# Patient Record
Sex: Female | Born: 1985 | Race: Black or African American | Hispanic: No | Marital: Married | Smoking: Never smoker
Health system: Southern US, Community
[De-identification: ages and names within clinical notes are randomized; demographics above are authoritative.]

## PROBLEM LIST (undated history)

## (undated) DIAGNOSIS — R200 Anesthesia of skin: Secondary | ICD-10-CM

## (undated) DIAGNOSIS — M79643 Pain in unspecified hand: Secondary | ICD-10-CM

## (undated) HISTORY — DX: Pain in unspecified hand: M79.643

## (undated) HISTORY — PX: OTHER SURGICAL HISTORY: SHX169

## (undated) HISTORY — DX: Anesthesia of skin: R20.0

---
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
## 2014-03-21 ENCOUNTER — Ambulatory Visit: Payer: Self-pay | Admitting: General Practice

## 2014-06-15 ENCOUNTER — Encounter: Payer: Self-pay | Admitting: Nurse Practitioner

## 2014-06-15 ENCOUNTER — Ambulatory Visit (INDEPENDENT_AMBULATORY_CARE_PROVIDER_SITE_OTHER): Payer: Medicaid Other | Admitting: Nurse Practitioner

## 2014-06-15 VITALS — BP 102/56 | HR 80 | Temp 98.6°F | Ht 63.0 in | Wt 150.0 lb

## 2014-06-15 DIAGNOSIS — Z7251 High risk heterosexual behavior: Secondary | ICD-10-CM

## 2014-06-15 DIAGNOSIS — B373 Candidiasis of vulva and vagina: Secondary | ICD-10-CM

## 2014-06-15 DIAGNOSIS — B3731 Acute candidiasis of vulva and vagina: Secondary | ICD-10-CM

## 2014-06-15 MED ORDER — FLUCONAZOLE 150 MG PO TABS: 150.0000 mg | ORAL_TABLET | Freq: Once | ORAL | Status: DC

## 2014-06-15 NOTE — Progress Notes (Signed)
   Subjective:    Patient ID: Michele Buckley, female    DOB: 09/20/1986, 28 y.o.   MRN: [id number]  HPI  Patient comes in stating that she had intercourse and the condom they were using was lost aand she thinks that it may be inside her.    Review of Systems  Constitutional: Negative.   HENT: Negative.   Respiratory: Negative.   Cardiovascular: Negative.   Genitourinary: Negative.   Neurological: Negative.   Psychiatric/Behavioral: Negative.   All other systems reviewed and are negative.      Objective:   Physical Exam  Constitutional: She is oriented to person, place, and time. She appears well-developed and well-nourished.  Cardiovascular: Normal rate and normal heart sounds.   Pulmonary/Chest: Effort normal and breath sounds normal.  Genitourinary: Vagina normal and uterus normal.  No foreign object found in vagina- thick white cheesy appearing discharge found  Neurological: She is alert and oriented to person, place, and time.  Skin: Skin is warm and dry.  Psychiatric: She has a normal mood and affect. Her behavior is normal. Judgment and thought content normal.    BP 102/56  Pulse 80  Temp(Src) 98.6 F (37 C) (Oral)  Ht 5\' 3"  (1.6 m)  Wt 150 lb (68.04 kg)  BMI 26.58 kg/m2  LMP 05/30/2014       Assessment & Plan:  1. High risk sexual behavior Continue to practice safe sex - STD Panel (HBSAG,HIV,RPR) - GC/Chlamydia Probe Amp  2. Vaginal candidiasis No douching - fluconazole (DIFLUCAN) 150 MG tablet; Take 1 tablet (150 mg total) by mouth once.  Dispense: 1 tablet; Refill: 0  Mary-Margaret Daphine DeutscherMartin, FNP

## 2014-06-20 ENCOUNTER — Telehealth: Payer: Self-pay | Admitting: Nurse Practitioner

## 2014-06-20 LAB — GC/CHLAMYDIA PROBE AMP
Chlamydia trachomatis, NAA: NEGATIVE
Neisseria gonorrhoeae by PCR: NEGATIVE

## 2014-06-21 ENCOUNTER — Telehealth: Payer: Self-pay | Admitting: Family Medicine

## 2014-06-21 NOTE — Telephone Encounter (Signed)
Message copied by Azalee CourseFULP, Emmaleigh Longo on Tue Jun 21, 2014 11:43 AM ------      Message from: Bennie PieriniMARTIN, MARY-MARGARET      Created: Mon Jun 20, 2014  7:42 AM       G/C negative ------

## 2014-06-21 NOTE — Telephone Encounter (Signed)
Just wait and see if symptoms return when complete period

## 2014-06-22 NOTE — Telephone Encounter (Signed)
Patient aware.

## 2014-07-01 ENCOUNTER — Telehealth: Payer: Self-pay | Admitting: Nurse Practitioner

## 2014-07-01 ENCOUNTER — Ambulatory Visit (INDEPENDENT_AMBULATORY_CARE_PROVIDER_SITE_OTHER): Payer: Medicaid Other | Admitting: Nurse Practitioner

## 2014-07-01 ENCOUNTER — Encounter: Payer: Self-pay | Admitting: Nurse Practitioner

## 2014-07-01 VITALS — BP 100/62 | HR 72 | Temp 98.0°F | Ht 63.0 in

## 2014-07-01 DIAGNOSIS — B9689 Other specified bacterial agents as the cause of diseases classified elsewhere: Secondary | ICD-10-CM

## 2014-07-01 DIAGNOSIS — A499 Bacterial infection, unspecified: Secondary | ICD-10-CM

## 2014-07-01 DIAGNOSIS — N898 Other specified noninflammatory disorders of vagina: Secondary | ICD-10-CM

## 2014-07-01 DIAGNOSIS — N760 Acute vaginitis: Secondary | ICD-10-CM

## 2014-07-01 LAB — POCT URINALYSIS DIPSTICK
BILIRUBIN UA: NEGATIVE
Glucose, UA: NEGATIVE
Ketones, UA: NEGATIVE
Leukocytes, UA: NEGATIVE
NITRITE UA: NEGATIVE
Protein, UA: NEGATIVE
RBC UA: NEGATIVE
SPEC GRAV UA: 1.01
Urobilinogen, UA: NEGATIVE
pH, UA: 7

## 2014-07-01 LAB — POCT UA - MICROSCOPIC ONLY
Bacteria, U Microscopic: NEGATIVE
Casts, Ur, LPF, POC: NEGATIVE
Crystals, Ur, HPF, POC: NEGATIVE
MUCUS UA: NEGATIVE
RBC, urine, microscopic: NEGATIVE
WBC, UR, HPF, POC: NEGATIVE
Yeast, UA: NEGATIVE

## 2014-07-01 LAB — POCT WET PREP (WET MOUNT): KOH Wet Prep POC: POSITIVE

## 2014-07-01 MED ORDER — METRONIDAZOLE 500 MG PO TABS: 500.0000 mg | ORAL_TABLET | Freq: Two times a day (BID) | ORAL | Status: DC

## 2014-07-01 NOTE — Patient Instructions (Signed)
Bacterial Vaginosis Bacterial vaginosis is a vaginal infection that occurs when the normal balance of bacteria in the vagina is disrupted. It results from an overgrowth of certain bacteria. This is the most common vaginal infection in women of childbearing age. Treatment is important to prevent complications, especially in pregnant women, as it can cause a premature delivery. CAUSES  Bacterial vaginosis is caused by an increase in harmful bacteria that are normally present in smaller amounts in the vagina. Several different kinds of bacteria can cause bacterial vaginosis. However, the reason that the condition develops is not fully understood. RISK FACTORS Certain activities or behaviors can put you at an increased risk of developing bacterial vaginosis, including:  Having a new sex partner or multiple sex partners.  Douching.  Using an intrauterine device (IUD) for contraception. Women do not get bacterial vaginosis from toilet seats, bedding, swimming pools, or contact with objects around them. SIGNS AND SYMPTOMS  Some women with bacterial vaginosis have no signs or symptoms. Common symptoms include:  Grey vaginal discharge.  A fishlike odor with discharge, especially after sexual intercourse.  Itching or burning of the vagina and vulva.  Burning or pain with urination. DIAGNOSIS  Your health care provider will take a medical history and examine the vagina for signs of bacterial vaginosis. A sample of vaginal fluid may be taken. Your health care provider will look at this sample under a microscope to check for bacteria and abnormal cells. A vaginal pH test may also be done.  TREATMENT  Bacterial vaginosis may be treated with antibiotic medicines. These may be given in the form of a pill or a vaginal cream. A second round of antibiotics may be prescribed if the condition comes back after treatment.  HOME CARE INSTRUCTIONS   Only take over-the-counter or prescription medicines as  directed by your health care provider.  If antibiotic medicine was prescribed, take it as directed. Make sure you finish it even if you start to feel better.  Do not have sex until treatment is completed.  Tell all sexual partners that you have a vaginal infection. They should see their health care provider and be treated if they have problems, such as a mild rash or itching.  Practice safe sex by using condoms and only having one sex partner. SEEK MEDICAL CARE IF:   Your symptoms are not improving after 3 days of treatment.  You have increased discharge or pain.  You have a fever. MAKE SURE YOU:   Understand these instructions.  Will watch your condition.  Will get help right away if you are not doing well or get worse. FOR MORE INFORMATION  Centers for Disease Control and Prevention, Division of STD Prevention: www.cdc.gov/std American Sexual Health Association (ASHA): www.ashastd.org  Document Released: 12/02/2005 Document Revised: 09/22/2013 Document Reviewed: 07/14/2013 ExitCare Patient Information 2015 [employer]. This information is not intended to replace advice given to you by your health care provider. Make sure you discuss any questions you have with your health care provider.  

## 2014-07-01 NOTE — Telephone Encounter (Signed)
ntbs

## 2014-07-01 NOTE — Telephone Encounter (Signed)
Appointment made for night clinic

## 2014-07-01 NOTE — Telephone Encounter (Signed)
S/S of odor and has a little discharge brownish of color

## 2014-07-01 NOTE — Progress Notes (Signed)
Subjective:    Patient ID: Michele Buckley, female    DOB: 05/20/1986, 28 y.o.   MRN: [id number]  Vaginal Discharge The patient's primary symptoms include a vaginal discharge (with odor. ). The patient's pertinent negatives include no genital itching. This is a new problem. The current episode started 1 to 4 weeks ago. The problem has been unchanged. The patient is experiencing no pain. She is not pregnant. Pertinent negatives include no abdominal pain, back pain, dysuria, fever, hematuria or painful intercourse. The vaginal discharge was thin and O[place]. There has been no bleeding. She has not been passing clots. She has not been passing tissue. Nothing aggravates the symptoms. She has tried antifungals for the symptoms. The treatment provided mild relief. She is not sexually active. She uses nothing for contraception. Her menstrual history has been regular. Her past medical history is significant for an STD and vaginosis.      Review of Systems  Constitutional: Negative.  Negative for fever.  HENT: Negative.   Eyes: Negative.   Respiratory: Negative.   Cardiovascular: Negative.   Gastrointestinal: Negative.  Negative for abdominal pain.  Endocrine: Negative.   Genitourinary: Positive for vaginal discharge (with odor. ). Negative for dysuria and hematuria.  Musculoskeletal: Negative.  Negative for back pain.  Skin: Negative.   Allergic/Immunologic: Negative.   Neurological: Negative.   Hematological: Negative.   Psychiatric/Behavioral: Negative.        Objective:   Physical Exam  Constitutional: She is oriented to person, place, and time. She appears well-developed and well-nourished.  HENT:  Head: Normocephalic.  Right Ear: Hearing, tympanic membrane, external ear and ear canal normal.  Left Ear: Hearing, tympanic membrane, external ear and ear canal normal.  Nose: Nose normal.  Mouth/Throat: Uvula is midline, oropharynx is clear and moist and mucous membranes are normal.  Eyes:  Conjunctivae and EOM are normal. Pupils are equal, round, and reactive to light.  Neck: Normal range of motion. Neck supple. No JVD present. No thyromegaly present.  Cardiovascular: Normal rate, normal heart sounds and intact distal pulses.   No murmur heard. Pulmonary/Chest: Effort normal and breath sounds normal. She has no wheezes. She has no rales.  Abdominal: Soft. Bowel sounds are normal. She exhibits no mass.  Genitourinary: Vaginal discharge found.  Musculoskeletal: Normal range of motion.  Neurological: She is alert and oriented to person, place, and time. She has normal reflexes.  Skin: Skin is warm and dry.  Psychiatric: She has a normal mood and affect. Her behavior is normal. Judgment and thought content normal.    BP 100/62  Pulse 72  Temp(Src) 98 F (36.7 C) (Oral)  Ht 5\' 3"  (1.6 m)  LMP 06/18/2014  Results for orders placed in visit on 07/01/14  POCT WET PREP (WET MOUNT)      Result Value Ref Range   Source Wet Prep POC vaginal     WBC, Wet Prep HPF POC 10-15     Bacteria Wet Prep HPF POC many     Clue Cells Wet Prep HPF POC Moderate     Yeast Wet Prep HPF POC Moderate     KOH Wet Prep POC pos     Trichomonas Wet Prep HPF POC none    POCT UA - MICROSCOPIC ONLY      Result Value Ref Range   WBC, Ur, HPF, POC neg     RBC, urine, microscopic neg     Bacteria, U Microscopic neg     Mucus, UA neg  Epithelial cells, urine per micros occ     Crystals, Ur, HPF, POC neg     Casts, Ur, LPF, POC neg     Yeast, UA neg    POCT URINALYSIS DIPSTICK      Result Value Ref Range   Color, UA yellow     Clarity, UA clear     Glucose, UA neg     Bilirubin, UA neg     Ketones, UA neg     Spec Grav, UA 1.010     Blood, UA neg     pH, UA 7.0     Protein, UA neg     Urobilinogen, UA negative     Nitrite, UA neg     Leukocytes, UA Negative          Assessment & Plan:   1. Vaginal discharge   2. Bacterial vaginosis    Meds ordered this encounter  Medications    . metroNIDAZOLE (FLAGYL) 500 MG tablet    Sig: Take 1 tablet (500 mg total) by mouth 2 (two) times daily.    Dispense:  7 tablet    Refill:  0    Order Specific Question:  Supervising Provider    Answer:  Ernestina PennaMOORE, DONALD W [[id number]]   Avoid alcohol while on antibiotic Safe sex encouraged Follow up prn  Mary-Margaret Daphine DeutscherMartin, FNP

## 2014-11-28 ENCOUNTER — Ambulatory Visit (INDEPENDENT_AMBULATORY_CARE_PROVIDER_SITE_OTHER): Payer: Medicaid Other

## 2014-11-28 ENCOUNTER — Ambulatory Visit (INDEPENDENT_AMBULATORY_CARE_PROVIDER_SITE_OTHER): Payer: Medicaid Other | Admitting: Nurse Practitioner

## 2014-11-28 ENCOUNTER — Encounter: Payer: Self-pay | Admitting: Nurse Practitioner

## 2014-11-28 VITALS — BP 99/58 | HR 67 | Temp 98.2°F | Ht 63.0 in | Wt 147.0 lb

## 2014-11-28 DIAGNOSIS — S93601A Unspecified sprain of right foot, initial encounter: Secondary | ICD-10-CM

## 2014-11-28 DIAGNOSIS — M79671 Pain in right foot: Secondary | ICD-10-CM

## 2014-11-28 NOTE — Progress Notes (Signed)
   Subjective:    Patient ID: Michele Buckley, female    DOB: 05/14/1986, 28 y.o.   MRN: [id number]  HPI Patient was walking down steps at her apartment and missed a step and twisted her right ankle. Went to urgent care and x ray showed no fracture. Still hurting.    Review of Systems  Constitutional: Negative.   HENT: Negative.   Respiratory: Negative.   Cardiovascular: Negative.   Genitourinary: Negative.   Neurological: Negative.   Psychiatric/Behavioral: Negative.   All other systems reviewed and are negative.      Objective:   Physical Exam  Constitutional: She is oriented to person, place, and time. She appears well-developed and well-nourished.  Cardiovascular: Normal rate, regular rhythm and normal heart sounds.   Pulmonary/Chest: Effort normal and breath sounds normal.  Musculoskeletal:  Nodule on right medial ankle- tender to touch. Pain on inversion. Mild edema  Neurological: She is alert and oriented to person, place, and time.  Skin: Skin is warm and dry.  Psychiatric: She has a normal mood and affect. Her behavior is normal. Judgment and thought content normal.   BP 99/58 mmHg  Pulse 67  Temp(Src) 98.2 F (36.8 C) (Oral)  Ht 5\' 3"  (1.6 m)  Wt 147 lb (66.679 kg)  BMI 26.05 kg/m2  Right foot xray- no fracture-Preliminary reading by Paulene FloorMary Ashelyn Mccravy, FNP  [facility]       Assessment & Plan:   1. Right foot pain   2. Foot sprain, right, initial encounter    Wrap with ace when up walking Ice if helps Motrin or tylenol OTC Mary-Margaret Daphine DeutscherMartin, FNP

## 2014-11-28 NOTE — Patient Instructions (Signed)
Foot Sprain The muscles and cord like structures which attach muscle to bone (tendons) that surround the feet are made up of units. A foot sprain can occur at the weakest spot in any of these units. This condition is most often caused by injury to or overuse of the foot, as from playing contact sports, or aggravating a previous injury, or from poor conditioning, or obesity. SYMPTOMS  Pain with movement of the foot.  Tenderness and swelling at the injury site.  Loss of strength is present in moderate or severe sprains. THE THREE GRADES OR SEVERITY OF FOOT SPRAIN ARE:  Mild (Grade I): Slightly pulled muscle without tearing of muscle or tendon fibers or loss of strength.  Moderate (Grade II): Tearing of fibers in a muscle, tendon, or at the attachment to bone, with small decrease in strength.  Severe (Grade III): Rupture of the muscle-tendon-bone attachment, with separation of fibers. Severe sprain requires surgical repair. Often repeating (chronic) sprains are caused by overuse. Sudden (acute) sprains are caused by direct injury or over-use. DIAGNOSIS  Diagnosis of this condition is usually by your own observation. If problems continue, a caregiver may be required for further evaluation and treatment. X-rays may be required to make sure there are not breaks in the bones (fractures) present. Continued problems may require physical therapy for treatment. PREVENTION  Use strength and conditioning exercises appropriate for your sport.  Warm up properly prior to working out.  Use athletic shoes that are made for the sport you are participating in.  Allow adequate time for healing. Early return to activities makes repeat injury more likely, and can lead to an unstable arthritic foot that can result in prolonged disability. Mild sprains generally heal in 3 to 10 days, with moderate and severe sprains taking 2 to 10 weeks. Your caregiver can help you determine the proper time required for  healing. HOME CARE INSTRUCTIONS   Apply ice to the injury for 15-20 minutes, 03-04 times per day. Put the ice in a plastic bag and place a towel between the bag of ice and your skin.  An elastic wrap (like an Ace bandage) may be used to keep swelling down.  Keep foot above the level of the heart, or at least raised on a footstool, when swelling and pain are present.  Try to avoid use other than gentle range of motion while the foot is painful. Do not resume use until instructed by your caregiver. Then begin use gradually, not increasing use to the point of pain. If pain does develop, decrease use and continue the above measures, gradually increasing activities that do not cause discomfort, until you gradually achieve normal use.  Use crutches if and as instructed, and for the length of time instructed.  Keep injured foot and ankle wrapped between treatments.  Massage foot and ankle for comfort and to keep swelling down. Massage from the toes up towards the knee.  Only take over-the-counter or prescription medicines for pain, discomfort, or fever as directed by your caregiver. SEEK IMMEDIATE MEDICAL CARE IF:   Your pain and swelling increase, or pain is not controlled with medications.  You have loss of feeling in your foot or your foot turns cold or blue.  You develop new, unexplained symptoms, or an increase of the symptoms that brought you to your caregiver. MAKE SURE YOU:   Understand these instructions.  Will watch your condition.  Will get help right away if you are not doing well or get worse. Document Released:   05/24/2002 Document Revised: 02/24/2012 Document Reviewed: 07/21/2008 ExitCare Patient Information 2015 [employer]. This information is not intended to replace advice given to you by your health care provider. Make sure you discuss any questions you have with your health care provider.  

## 2015-12-13 IMAGING — CR DG FOOT COMPLETE 3+V*R*
3 series · 3 of 3 positions shown · non-contrast
Comparison: 01/22/2014.

CLINICAL DATA: Right foot pain. No reported injury. Initial
evaluation.

EXAM:
RIGHT FOOT COMPLETE - 3+ VIEW

[view not recorded (1 of 3)]
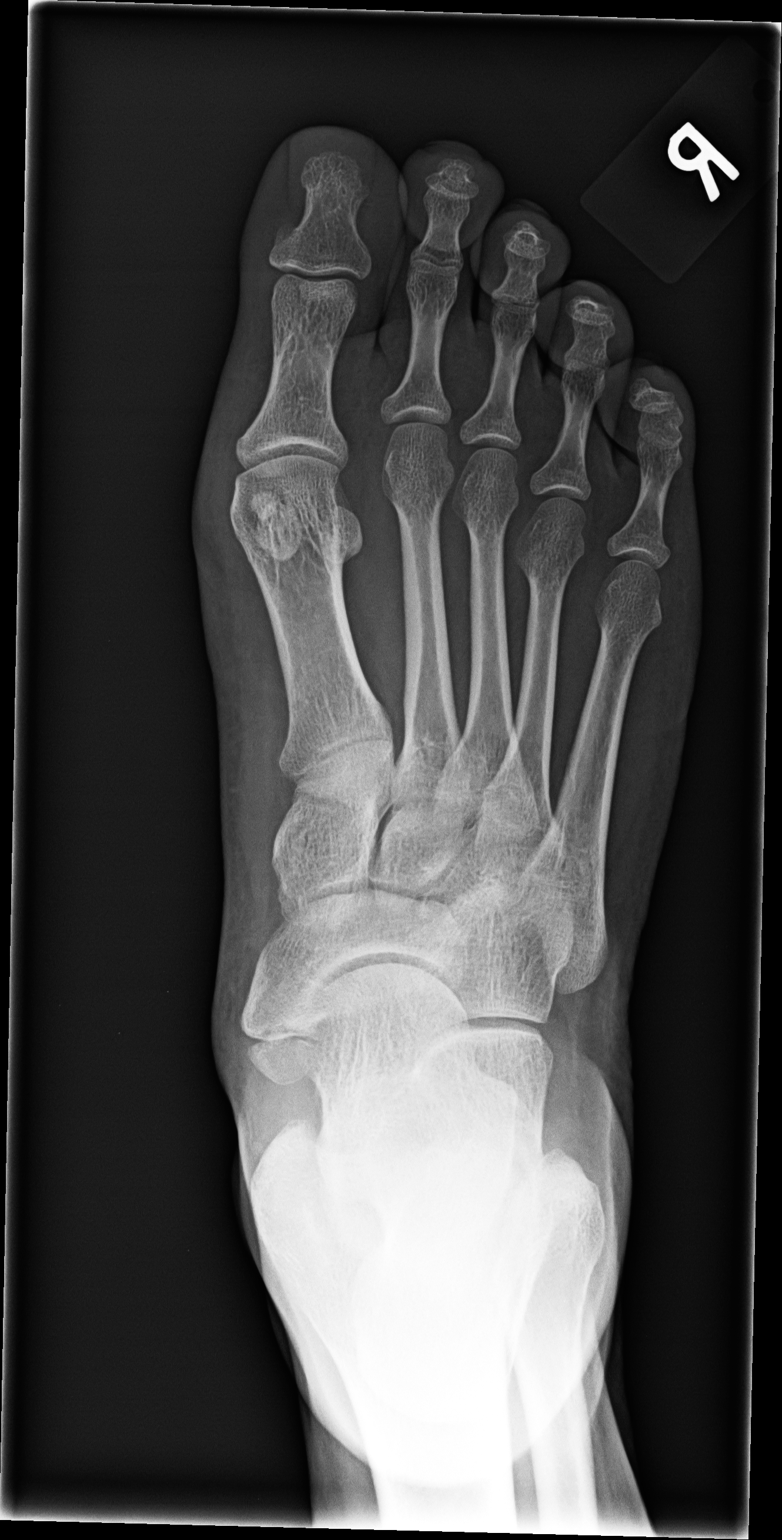

[view not recorded (2 of 3)]
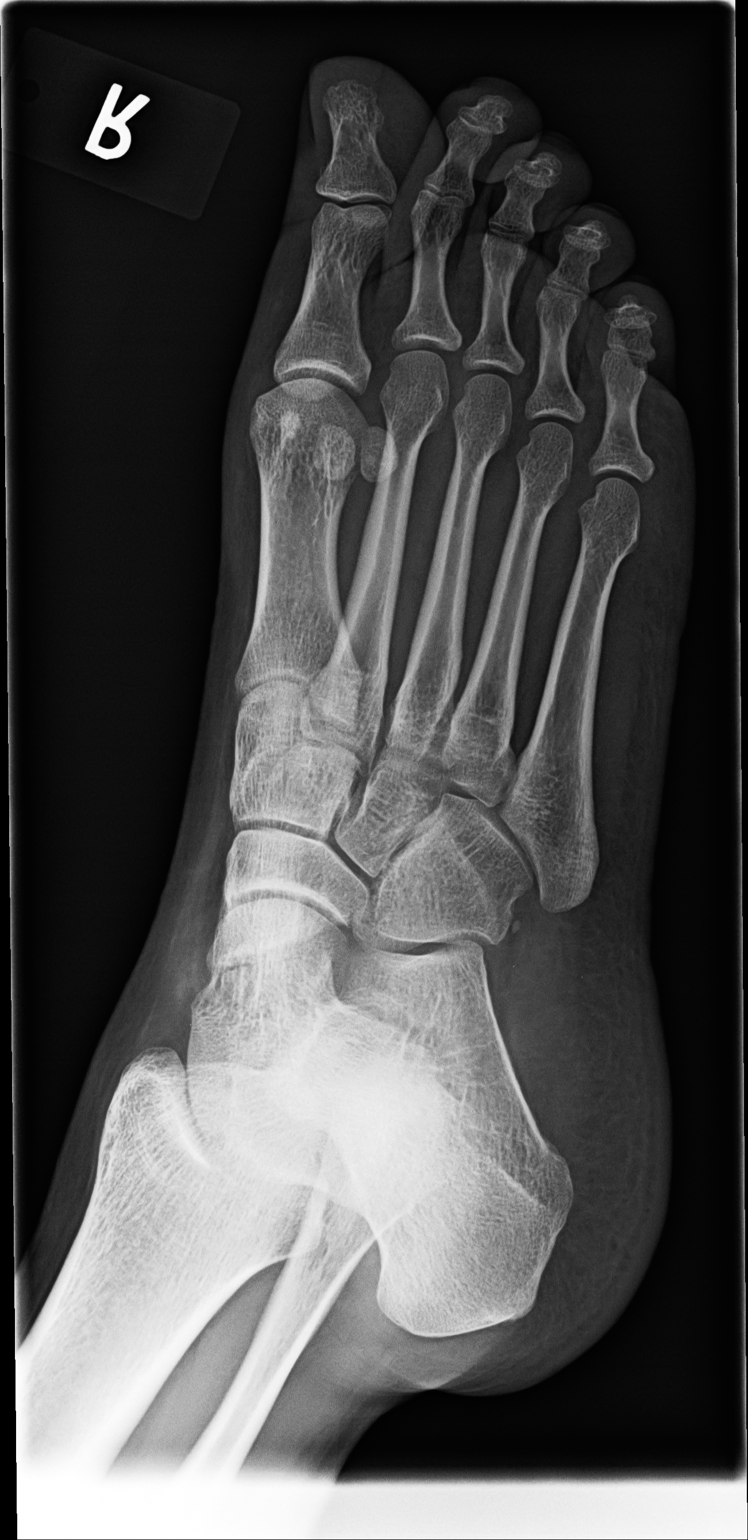

[view not recorded (3 of 3)]
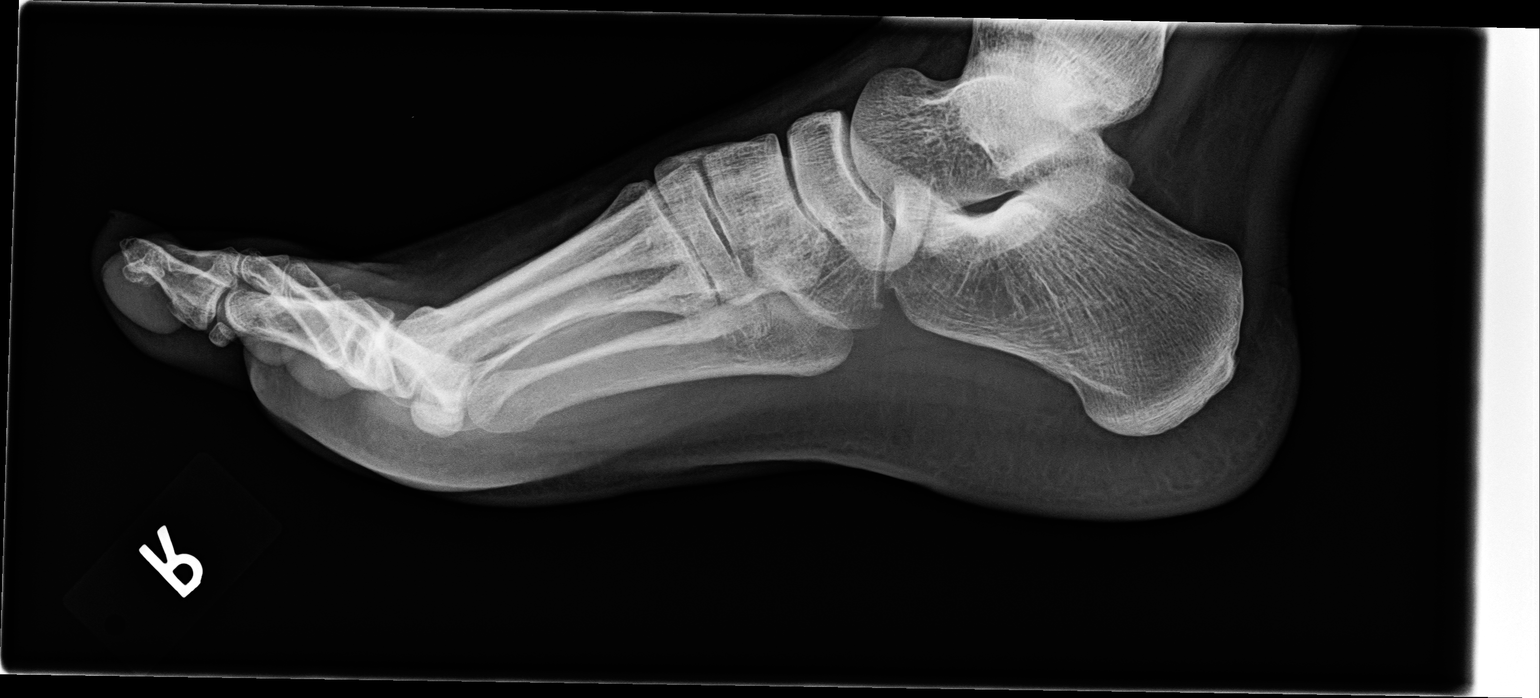

[3 of 3 positions shown; findings below may reference images not displayed]

FINDINGS: There is no evidence of fracture or dislocation. There is no
evidence of arthropathy or other focal bone abnormality. Soft
tissues are unremarkable.
IMPRESSION: Negative.

## 2019-11-19 ENCOUNTER — Encounter: Payer: Self-pay | Admitting: Cardiology

## 2019-11-19 ENCOUNTER — Encounter: Payer: Self-pay | Admitting: *Deleted

## 2019-11-19 ENCOUNTER — Ambulatory Visit: Payer: Medicaid Other | Admitting: Cardiology

## 2019-11-19 ENCOUNTER — Other Ambulatory Visit: Payer: Self-pay

## 2019-11-19 VITALS — BP 104/64 | HR 68 | Ht 63.0 in | Wt 173.0 lb

## 2019-11-19 DIAGNOSIS — R011 Cardiac murmur, unspecified: Secondary | ICD-10-CM

## 2019-11-19 DIAGNOSIS — R0789 Other chest pain: Secondary | ICD-10-CM | POA: Diagnosis not present

## 2019-11-19 NOTE — Patient Instructions (Signed)
Medication Instructions:   Your physician recommends that you continue on your current medications as directed. Please refer to the Current Medication list given to you today.  *If you need a refill on your cardiac medications before your next appointment, please call your pharmacy*    Testing/Procedures:  Your physician has requested that you have an echocardiogram. Echocardiography is a painless test that uses sound waves to create images of your heart. It provides your doctor with information about the size and shape of your heart and how well your heart's chambers and valves are working. This procedure takes approximately one hour. There are no restrictions for this procedure.    Follow-Up: At [facility], you and your health needs are our priority.  As part of our continuing mission to provide you with exceptional heart care, we have created designated Provider Care Teams.  These Care Teams include your primary Cardiologist (physician) and Advanced Practice Providers (APPs -  Physician Assistants and Nurse Practitioners) who all work together to provide you with the care you need, when you need it.    Your next appointment:    AS NEEDED WITH DR. Marlou Porch

## 2019-11-19 NOTE — Progress Notes (Signed)
Cardiology Office Note:    Date:  11/19/2019   ID:  Cicero Duck, DOB 08-18-1986, MRN [id number]  PCP:  Bennie Pierini, FNP  Cardiologist:  Donato Schultz, MD  Electrophysiologist:  None   Referring MD: [facility]     History of Present Illness:    Michele Buckley is a 33 y.o. female here for the evaluation of heart murmur at the request of Dr. Lynelle Smoke.  Michele Moris PA heard murmur.   Has scoliosis.   Mother has a murmur, 2 of them she states. Daughter has murmur.   Minimal SOB maybe anxiety. Has had CP at times, like a hit or bruise feeling. Can happen sitting, sneeze hurts. Felt sternum click.   Boyfriend smokes. She does not.   No known early family history of heart disease.   Past Medical History:  Diagnosis Date  . Hand pain   . Numbness of hand     Past Surgical History:  Procedure Laterality Date  . DIVISION OF FALLOPIAN TUBE      Current Medications: Current Meds  Medication Sig  . Cholecalciferol (VITAMIN D3) 25 MCG (1000 UT) CAPS Take by mouth once a week.   Marland Kitchen LORazepam (ATIVAN) 0.5 MG tablet Take 0.5 mg by mouth every 8 (eight) hours.  . naproxen sodium (ANAPROX) 550 MG tablet Take 550 mg by mouth 2 (two) times daily with a meal.  . valACYclovir (VALTREX) 500 MG tablet Take 500 mg by mouth 2 (two) times daily.     Allergies:   Patient has no known allergies.   Social History   Socioeconomic History  . Marital status: Married    Spouse name: Not on file  . Number of children: Not on file  . Years of education: Not on file  . Highest education level: Not on file  Occupational History  . Not on file  Social Needs  . Financial resource strain: Not on file  . Food insecurity    Worry: Not on file    Inability: Not on file  . Transportation needs    Medical: Not on file    Non-medical: Not on file  Tobacco Use  . Smoking status: Never Smoker  . Smokeless tobacco: Never Used  Substance and Sexual Activity  . Alcohol  use: Yes    Comment: occ  . Drug use: No  . Sexual activity: Not on file  Lifestyle  . Physical activity    Days per week: Not on file    Minutes per session: Not on file  . Stress: Not on file  Relationships  . Social Musician on phone: Not on file    Gets together: Not on file    Attends religious service: Not on file    Active member of club or organization: Not on file    Attends meetings of clubs or organizations: Not on file    Relationship status: Not on file  Other Topics Concern  . Not on file  Social History Narrative  . Not on file     Family History: The patient's family history includes Diabetes in her father and mother; Hypertension in her father and mother.  ROS:   Please see the history of present illness.     All other systems reviewed and are negative.  EKGs/Labs/Other Studies Reviewed:    The following studies were reviewed today: EKG, prior office notes  EKG:  EKG is  ordered today.  The ekg ordered today demonstrates sinus  rhythm, 68 with minor sinus arrhythmia, normal  Recent Labs: No results found for requested labs within last 8760 hours.  Recent Lipid Panel No results found for: CHOL, TRIG, HDL, CHOLHDL, VLDL, LDLCALC, LDLDIRECT   Creatinine 0.72 potassium 4.1 ALT 15, hemoglobin 12.9 TSH normal LDL 109  Physical Exam:    VS:  BP 104/64   Pulse 68   Ht 5\' 3"  (1.6 m)   Wt 173 lb (78.5 kg)   SpO2 99%   BMI 30.65 kg/m     Wt Readings from Last 3 Encounters:  11/19/19 173 lb (78.5 kg)  11/28/14 147 lb (66.7 kg)  06/15/14 150 lb (68 kg)     GEN:  Well nourished, well developed in no acute distress HEENT: Normal NECK: No JVD; No carotid bruits LYMPHATICS: No lymphadenopathy CARDIAC: RRR, 1/6 systolic murmur, no rubs, gallops RESPIRATORY:  Clear to auscultation without rales, wheezing or rhonchi  ABDOMEN: Soft, non-tender, non-distended MUSCULOSKELETAL:  No edema; No deformity  SKIN: Warm and dry NEUROLOGIC:  Alert and  oriented x 3 PSYCHIATRIC:  Normal affect   ASSESSMENT:    1. Murmur   2. Atypical chest pain    PLAN:    In order of problems listed above:  Heart murmur -We will go ahead and check an echocardiogram to ensure proper structure and function.  Could be flow murmur.  Good pick up on the heart murmur.  Atypical chest pain -Does sound musculoskeletal agree with prior diagnosis of costochondritis type symptoms.  Encouraged daily stretching, deep breathing exercises, occasional ibuprofen if necessary.  Reassured her.   Medication Adjustments/Labs and Tests Ordered: Current medicines are reviewed at length with the patient today.  Concerns regarding medicines are outlined above.  Orders Placed This Encounter  Procedures  . EKG 12-Lead  . ECHOCARDIOGRAM COMPLETE   No orders of the defined types were placed in this encounter.   Patient Instructions  Medication Instructions:   Your physician recommends that you continue on your current medications as directed. Please refer to the Current Medication list given to you today.  *If you need a refill on your cardiac medications before your next appointment, please call your pharmacy*    Testing/Procedures:  Your physician has requested that you have an echocardiogram. Echocardiography is a painless test that uses sound waves to create images of your heart. It provides your doctor with information about the size and shape of your heart and how well your heart's chambers and valves are working. This procedure takes approximately one hour. There are no restrictions for this procedure.    Follow-Up: At [facility], you and your health needs are our priority.  As part of our continuing mission to provide you with exceptional heart care, we have created designated Provider Care Teams.  These Care Teams include your primary Cardiologist (physician) and Advanced Practice Providers (APPs -  Physician Assistants and Nurse Practitioners) who all  work together to provide you with the care you need, when you need it.    Your next appointment:    AS NEEDED WITH DR. Marlou Porch     Signed, Candee Furbish, MD  11/19/2019 12:18 PM    [place]

## 2019-11-26 ENCOUNTER — Other Ambulatory Visit (HOSPITAL_COMMUNITY): Payer: Medicaid Other

## 2019-12-13 ENCOUNTER — Ambulatory Visit (HOSPITAL_COMMUNITY): Payer: BC Managed Care – PPO | Attending: Cardiology

## 2019-12-13 ENCOUNTER — Other Ambulatory Visit: Payer: Self-pay

## 2019-12-13 DIAGNOSIS — R011 Cardiac murmur, unspecified: Secondary | ICD-10-CM | POA: Insufficient documentation

## 2020-12-01 ENCOUNTER — Other Ambulatory Visit: Payer: Self-pay

## 2020-12-01 ENCOUNTER — Ambulatory Visit (INDEPENDENT_AMBULATORY_CARE_PROVIDER_SITE_OTHER): Payer: Medicaid Other

## 2020-12-01 ENCOUNTER — Ambulatory Visit
Admission: EM | Admit: 2020-12-01 | Discharge: 2020-12-01 | Disposition: A | Discharge status: Home / Self Care | Payer: Medicaid Other | Attending: Emergency Medicine | Admitting: Emergency Medicine

## 2020-12-01 DIAGNOSIS — M79644 Pain in right finger(s): Secondary | ICD-10-CM

## 2020-12-01 DIAGNOSIS — W231XXA Caught, crushed, jammed, or pinched between stationary objects, initial encounter: Secondary | ICD-10-CM

## 2020-12-01 DIAGNOSIS — S62639A Displaced fracture of distal phalanx of unspecified finger, initial encounter for closed fracture: Secondary | ICD-10-CM | POA: Diagnosis not present

## 2020-12-01 MED ORDER — IBUPROFEN 800 MG PO TABS: 800.0000 mg | ORAL_TABLET | Freq: Three times a day (TID) | ORAL | 0 refills | Status: AC

## 2020-12-01 NOTE — ED Triage Notes (Signed)
Pt present with right index finger injury  After shutting in car door

## 2020-12-01 NOTE — Discharge Instructions (Addendum)
Take ibuprofen as prescribed for pain Follow-up with orthopedic Follow RICE instructions as attached Return or go to ED if you develop any new or worsening of your symptoms

## 2020-12-01 NOTE — ED Provider Notes (Signed)
[facility]   [id number] 12/01/20 Arrival Time: 1614   Chief Complaint  Patient presents with  . Finger Injury     SUBJECTIVE: History from: patient.  Michele Buckley is a 34 y.o. female presents the urgent care for complaint of right index finger injury that occurred today.  Reported she close a door on her finger.  She localizes the pain to the right index finger.  She describes the pain as constant and achy.  She has tried OTC medications without relief.  Her symptoms are made worse with ROM.  Denies similar symptoms in the past. Denies chills, fever, nausea, vomiting, diarrhea denies  ROS: As per HPI.  All other pertinent ROS negative.      Past Medical History:  Diagnosis Date  . Hand pain   . Numbness of hand    Past Surgical History:  Procedure Laterality Date  . DIVISION OF FALLOPIAN TUBE     No Known Allergies No current facility-administered medications on file prior to encounter.   Current Outpatient Medications on File Prior to Encounter  Medication Sig Dispense Refill  . Cholecalciferol (VITAMIN D3) 25 MCG (1000 UT) CAPS Take by mouth once a week.     Marland Kitchen LORazepam (ATIVAN) 0.5 MG tablet Take 0.5 mg by mouth every 8 (eight) hours.    . naproxen sodium (ANAPROX) 550 MG tablet Take 550 mg by mouth 2 (two) times daily with a meal.    . valACYclovir (VALTREX) 500 MG tablet Take 500 mg by mouth 2 (two) times daily.     Social History   Socioeconomic History  . Marital status: Married    Spouse name: Not on file  . Number of children: Not on file  . Years of education: Not on file  . Highest education level: Not on file  Occupational History  . Not on file  Tobacco Use  . Smoking status: Never Smoker  . Smokeless tobacco: Never Used  Substance and Sexual Activity  . Alcohol use: Yes    Comment: occ  . Drug use: No  . Sexual activity: Not on file  Other Topics Concern  . Not on file  Social History Narrative  . Not on file   Social  Determinants of Health   Financial Resource Strain: Not on file  Food Insecurity: Not on file  Transportation Needs: Not on file  Physical Activity: Not on file  Stress: Not on file  Social Connections: Not on file  Intimate Partner Violence: Not on file   Family History  Problem Relation Age of Onset  . Hypertension Mother   . Diabetes Mother   . Hypertension Father   . Diabetes Father     OBJECTIVE:  There were no vitals filed for this visit.   Physical Exam Vitals and nursing note reviewed.  Constitutional:      General: She is not in acute distress.    Appearance: Normal appearance. She is normal weight. She is not ill-appearing, toxic-appearing or diaphoretic.  HENT:     Head: Normocephalic.  Cardiovascular:     Rate and Rhythm: Normal rate and regular rhythm.     Pulses: Normal pulses.     Heart sounds: Normal heart sounds. No murmur heard. No friction rub. No gallop.   Pulmonary:     Effort: Pulmonary effort is normal. No respiratory distress.     Breath sounds: Normal breath sounds. No stridor. No wheezing, rhonchi or rales.  Chest:     Chest wall: No tenderness.  Musculoskeletal:        General: Tenderness present.     Right hand: Tenderness present.     Left hand: Normal.     Comments: The right hand is with obvious deformity compared to the left hand.  Ecchymosis swelling present on right index finger.  There is no open wound, lesion, warmth, subungual hematoma present.  Limited range of motion on right index finger due to pain.  Neurovascular status intact.  Neurological:     Mental Status: She is alert and oriented to person, place, and time.      LABS:  No results found for this or any previous visit (from the past 24 hour(s)).   RADIOLOGY:  DG Hand Complete Right  Result Date: 12/01/2020 CLINICAL DATA:  Crush injury right index finger in a car door today. Initial encounter. EXAM: RIGHT HAND - COMPLETE 3+ VIEW COMPARISON:  None. FINDINGS: The  patient has a nondisplaced fracture of the tuft of the index finger. Associated soft tissue swelling is noted. No other bony or joint abnormality. IMPRESSION: Nondisplaced tuft fracture right index finger. Electronically Signed   By: Drusilla Kanner M.D.   On: 12/01/2020 16:58    Right index finger x-ray is positive for nondisplaced tuft fracture of right index.  I have reviewed the x-ray myself and the radiologist interpretation.  I am in agreement with the radiologist interpretation.   ASSESSMENT & PLAN:  1. Pain of finger of right hand   2. Closed fracture of tuft of distal phalanx of finger     Meds ordered this encounter  Medications  . ibuprofen (ADVIL) 800 MG tablet    Sig: Take 1 tablet (800 mg total) by mouth 3 (three) times daily.    Dispense:  30 tablet    Refill:  0   Discharge instructions  Take ibuprofen as prescribed for pain Follow-up with orthopedic Follow RICE instructions as attached Return or go to ED if you develop any new or worsening of your symptoms  Reviewed expectations re: course of current medical issues. Questions answered. Outlined signs and symptoms indicating need for more acute intervention. Patient verbalized understanding. After Visit Summary given.         Durward Parcel, FNP 12/01/20 1725

## 2020-12-04 ENCOUNTER — Telehealth: Payer: Self-pay | Admitting: Orthopedic Surgery

## 2020-12-04 NOTE — Telephone Encounter (Signed)
-----   Message from [facility], RT sent at 12/04/2020  9:12 AM EST ----- Regarding: RE: Urgent Care patient requests immediate appointment Use the 9am slot 12/22 with Dr Romeo Apple.   ----- Message ----- From: Doristine Section Sent: 12/04/2020   9:01 AM EST To: Cherre Huger, RT Subject: Urgent Care patient requests immediate appoi#  Voice message received 8:30am today, 12/04/20, requesting appointment for today or tomorrow, which we have only the 11:40 same day slot today -  patient states she was referred from [facility], [place], for finger fracture due to crush injury - caught in door.  Please advise.

## 2020-12-04 NOTE — Telephone Encounter (Signed)
Patient called back to relay she has already found another provider; thanked [place].

## 2020-12-04 NOTE — Telephone Encounter (Signed)
I called patient initially per voice message left this morning, 12/04/20 to relay her message regarding request for appointment for as soon as possible is being addressed; patient aware.  Called back to patient to offer, per practice administrator Wendy's response. Reached voice mail, left message.

## 2021-12-16 IMAGING — DX DG HAND COMPLETE 3+V*R*
3 series · 3 of 3 positions shown · non-contrast
Comparison: None.

CLINICAL DATA: Crush injury right index finger in a car door today.
Initial encounter.

EXAM:
RIGHT HAND - COMPLETE 3+ VIEW

[hand pa]
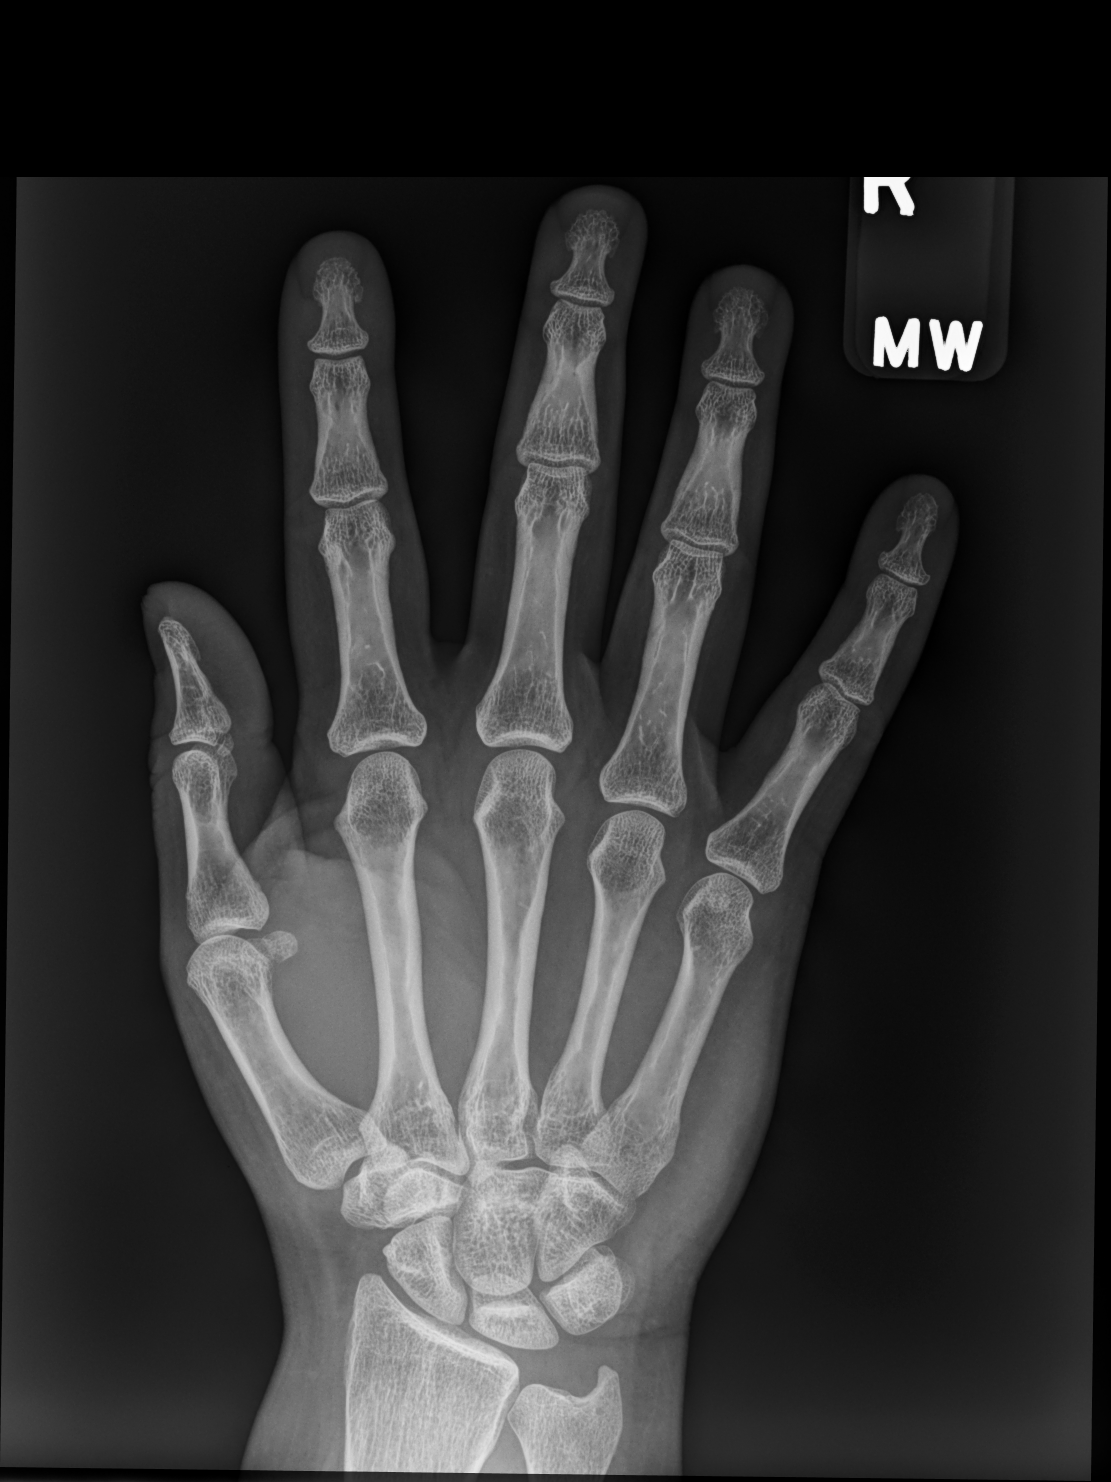

[hand mlo]
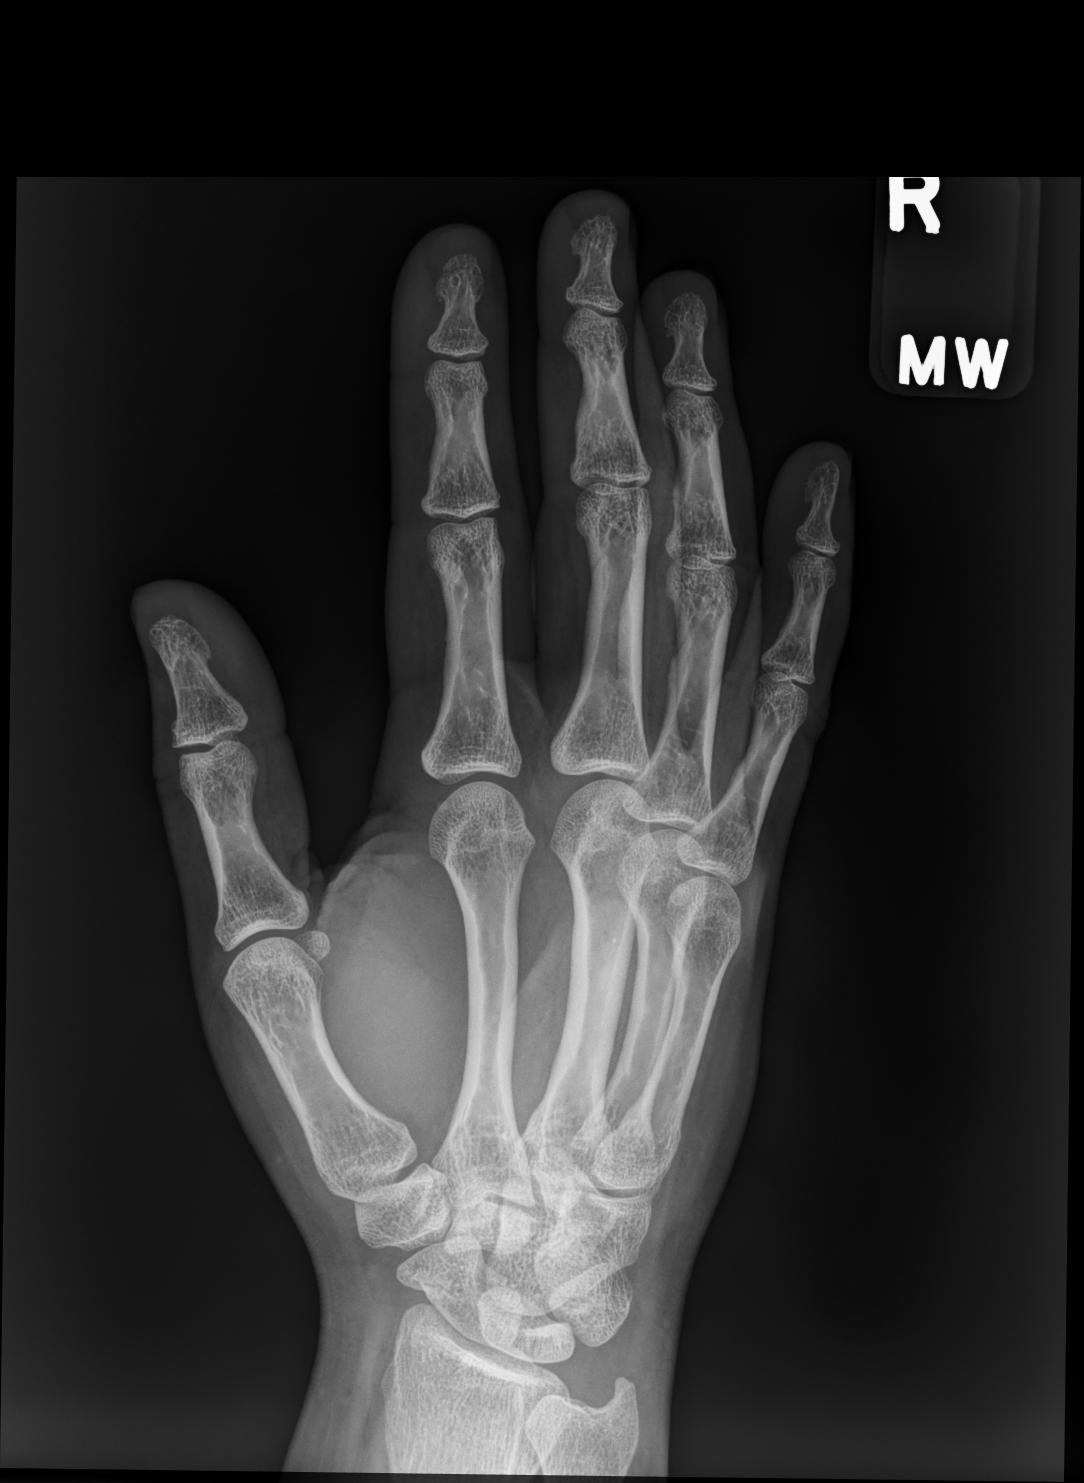

[hand lat]
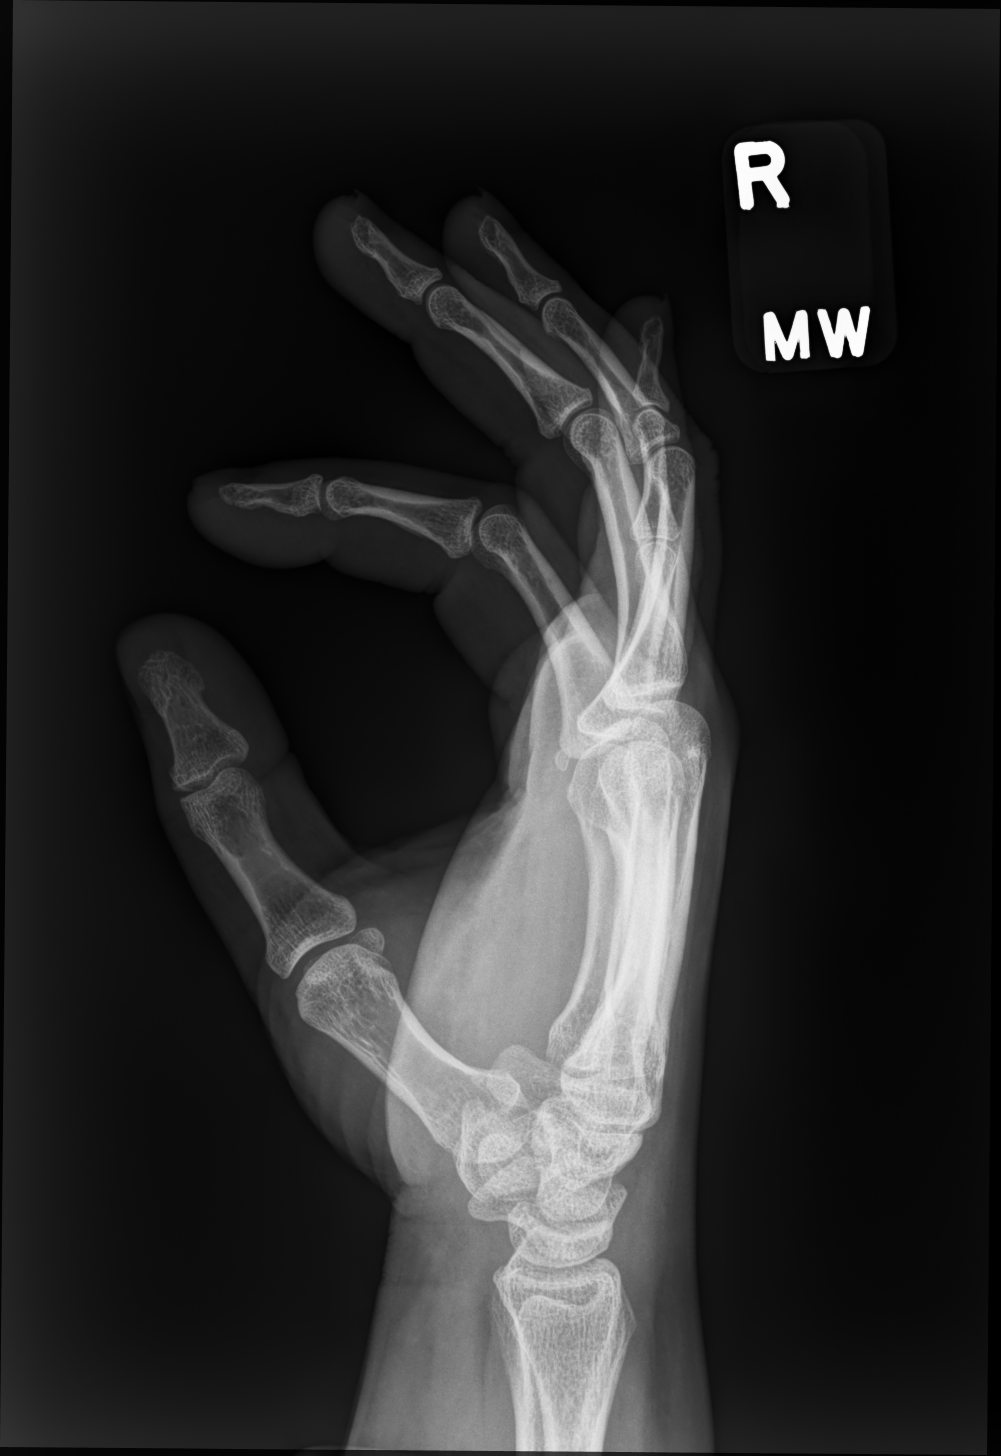

[3 of 3 positions shown; findings below may reference images not displayed]

FINDINGS: The patient has a nondisplaced fracture of the tuft of the index
finger. Associated soft tissue swelling is noted. No other bony or
joint abnormality.
IMPRESSION: Nondisplaced tuft fracture right index finger.
